# Patient Record
Sex: Female | Born: 1990 | Race: Black or African American | Hispanic: No | Marital: Single | Smoking: Never smoker
Health system: Southern US, Community
[De-identification: ages and names within clinical notes are randomized; demographics above are authoritative.]

---

## 2015-09-24 ENCOUNTER — Encounter (HOSPITAL_COMMUNITY): Payer: Self-pay | Admitting: Emergency Medicine

## 2015-09-24 ENCOUNTER — Emergency Department (INDEPENDENT_AMBULATORY_CARE_PROVIDER_SITE_OTHER)
Admission: EM | Admit: 2015-09-24 | Discharge: 2015-09-24 | Disposition: A | Discharge status: Home / Self Care | Payer: BLUE CROSS/BLUE SHIELD | Source: Home / Self Care | Attending: Emergency Medicine | Admitting: Emergency Medicine

## 2015-09-24 DIAGNOSIS — R0989 Other specified symptoms and signs involving the circulatory and respiratory systems: Secondary | ICD-10-CM

## 2015-09-24 NOTE — Discharge Instructions (Signed)
You likely swallowed the bit of latex glove. I do not see any sign of it being in your respiratory system. As long as you continue to be able to eat and drink normally, you don't have anything to worry about. If you develop trouble swallowing, please go to the emergency room.

## 2015-09-24 NOTE — ED Provider Notes (Signed)
CSN: [id number]     Arrival date & time 09/24/15  1554 History   First MD Initiated Contact with Patient 09/24/15 1635     Chief Complaint  Patient presents with  . Foreign Body   (Consider location/radiation/quality/duration/timing/severity/associated sxs/prior Treatment) HPI  She is a 25 year old woman here for possible foreign body. She states this afternoon she was eating a hot dog while wearing latex gloves and she accidentally swallowed part of the glove. She denies any difficulty breathing. She has been able to eat some bread after this incident. She does state it feels like there is something in her throat.  History reviewed. No pertinent past medical history. History reviewed. No pertinent past surgical history. No family history on file. Social History  Substance Use Topics  . Smoking status: Never Smoker   . Smokeless tobacco: None  . Alcohol Use: Yes   OB History    No data available     Review of Systems As in history of present illness Allergies  Review of patient's allergies indicates no known allergies.  Home Medications   Prior to Admission medications   Medication Sig Start Date End Date Taking? Authorizing Provider  metFORMIN (GLUCOPHAGE) 500 MG tablet Take by mouth 2 (two) times daily with a meal.   Yes Historical Provider, MD   Meds Ordered and Administered this Visit  Medications - No data to display  BP 120/74 mmHg  Pulse 70  Temp(Src) 98.4 F (36.9 C) (Oral)  SpO2 99%  LMP 08/14/2015 No data found.   Physical Exam  Constitutional: She is oriented to person, place, and time. She appears well-developed and well-nourished. No distress.  HENT:  Mouth/Throat: Oropharynx is clear and moist. No oropharyngeal exudate.  No foreign body visualized in oropharynx.  Cardiovascular: Normal rate.   Pulmonary/Chest: Effort normal.  Neurological: She is alert and oriented to person, place, and time.    ED Course  Procedures (including critical care  time)  Labs Review Labs Reviewed - No data to display  Imaging Review No results found.   MDM   1. Foreign body sensation in throat    She likely swallowed the latex glove. She does not have any respiratory distress and has been able to eat bread since the incident, I suspect she will not have any further problems. Return precautions reviewed.    Charm RingsErin J Honig, MD 09/24/15 [phone]

## 2015-09-24 NOTE — ED Notes (Signed)
Pt reports about an hour to 1.5 hours ago she was eating a hot dog and had latex gloves on... She bit the hotdog along w/the glove and swallowed it Since then, she's had a sensation of the piece of glove lodged in the back of her throat that's now causing some dyspnea A&O x4... Talking in complete sentences and laughing... No acute distress.

## 2017-09-27 DIAGNOSIS — N920 Excessive and frequent menstruation with regular cycle: Secondary | ICD-10-CM | POA: Diagnosis not present

## 2017-09-27 DIAGNOSIS — I499 Cardiac arrhythmia, unspecified: Secondary | ICD-10-CM | POA: Diagnosis not present

## 2017-09-27 DIAGNOSIS — Z0001 Encounter for general adult medical examination with abnormal findings: Secondary | ICD-10-CM | POA: Diagnosis not present

## 2017-09-27 DIAGNOSIS — Z13220 Encounter for screening for lipoid disorders: Secondary | ICD-10-CM | POA: Diagnosis not present

## 2017-09-27 DIAGNOSIS — Z6834 Body mass index (BMI) 34.0-34.9, adult: Secondary | ICD-10-CM | POA: Diagnosis not present

## 2017-09-27 DIAGNOSIS — Z130 Encounter for screening for diseases of the blood and blood-forming organs and certain disorders involving the immune mechanism: Secondary | ICD-10-CM | POA: Diagnosis not present

## 2017-09-27 DIAGNOSIS — E282 Polycystic ovarian syndrome: Secondary | ICD-10-CM | POA: Diagnosis not present

## 2017-09-27 DIAGNOSIS — Z131 Encounter for screening for diabetes mellitus: Secondary | ICD-10-CM | POA: Diagnosis not present

## 2017-12-29 ENCOUNTER — Encounter (HOSPITAL_COMMUNITY): Payer: Self-pay | Admitting: Emergency Medicine

## 2017-12-29 ENCOUNTER — Ambulatory Visit (HOSPITAL_COMMUNITY)
Admission: EM | Admit: 2017-12-29 | Discharge: 2017-12-29 | Disposition: A | Discharge status: Home / Self Care | Payer: BLUE CROSS/BLUE SHIELD | Attending: Family Medicine | Admitting: Family Medicine

## 2017-12-29 DIAGNOSIS — N750 Cyst of Bartholin's gland: Secondary | ICD-10-CM | POA: Diagnosis not present

## 2017-12-29 MED ORDER — SULFAMETHOXAZOLE-TRIMETHOPRIM 800-160 MG PO TABS: 1.0000 | ORAL_TABLET | Freq: Two times a day (BID) | ORAL | 0 refills | Status: AC

## 2017-12-29 NOTE — Discharge Instructions (Addendum)
Apply warm compresses 3-4x daily for 10-15 minutes Wash site daily with warm water and mild soap Keep covered to avoid friction Take antibiotic as prescribed and to completion [facility] and management of symptoms. Return or go to the ED if you have any new or worsening symptoms

## 2017-12-29 NOTE — ED Provider Notes (Signed)
[facility]   [id number] 12/29/17 Arrival Time: 1310   SUBJECTIVE:  Pricilla LarssonSherika Shanae Rolfe is a 27 y.o. female who presents with complaints of abrupt onset of abscess in her vagina that started yesterday.  She denies a precipitating event, such as shaving or trauma.  Describes pain as constant and "pressure."  Symptoms are made worse with coughing or laughing.  She reports similar symptoms in the past and had an incision and drainage performed.  She denies fever, chills, nausea, vomiting, abdominal or pelvic pain, urinary symptoms, vaginal itching, vaginal odor, vaginal bleeding, dyspareunia, vaginal rashes or lesions.   Patient's last menstrual period was 12/12/2017.  ROS: As per HPI.  History reviewed. No pertinent past medical history. History reviewed. No pertinent surgical history. No Known Allergies No current facility-administered medications on file prior to encounter.    Current Outpatient Medications on File Prior to Encounter  Medication Sig Dispense Refill  . metFORMIN (GLUCOPHAGE) 500 MG tablet Take by mouth 2 (two) times daily with a meal.      Social History   Socioeconomic History  . Marital status: Single    Spouse name: Not on file  . Number of children: Not on file  . Years of education: Not on file  . Highest education level: Not on file  Occupational History  . Not on file  Social Needs  . Financial resource strain: Not on file  . Food insecurity:    Worry: Not on file    Inability: Not on file  . Transportation needs:    Medical: Not on file    Non-medical: Not on file  Tobacco Use  . Smoking status: Never Smoker  Substance and Sexual Activity  . Alcohol use: Yes  . Drug use: No  . Sexual activity: Not on file  Lifestyle  . Physical activity:    Days per week: Not on file    Minutes per session: Not on file  . Stress: Not on file  Relationships  . Social connections:    Talks on phone: Not on file    Gets together: Not on file   Attends religious service: Not on file    Active member of club or organization: Not on file    Attends meetings of clubs or organizations: Not on file    Relationship status: Not on file  . Intimate partner violence:    Fear of current or ex partner: Not on file    Emotionally abused: Not on file    Physically abused: Not on file    Forced sexual activity: Not on file  Other Topics Concern  . Not on file  Social History Narrative  . Not on file   No family history on file.  OBJECTIVE:  Vitals:   12/29/17 1347  BP: 99/68  Pulse: 72  Resp: 16  Temp: 98.5 F (36.9 C)  SpO2: 100%     General appearance: alert, cooperative, appears stated age and no distress Throat: lips, mucosa, and tongue normal; teeth and gums normal Lungs: CTA bilaterally without adventitious breath sounds Heart: regular rate and rhythm.  Radial pulses 2+ symmetrical bilaterally Back: no CVA tenderness Abdomen: soft, non-tender; bowel sounds normal; no masses or organomegaly; no guarding or rebound tenderness GU: external examination without obvious vulvar lesions, erythema or discharge, small 1-2 cm left sided bartholin's cyst without obvious erythema or induration; mildly tender to palpation  Skin: warm and dry Psychological:  Alert and cooperative. Normal mood and affect.  ASSESSMENT & PLAN:  1. Bartholin cyst     Meds ordered this encounter  Medications  . sulfamethoxazole-trimethoprim (BACTRIM DS,SEPTRA DS) 800-160 MG tablet    Sig: Take 1 tablet by mouth 2 (two) times daily for 7 days.    Dispense:  14 tablet    Refill:  0    Order Specific Question:   Supervising Provider    Answer:   Isa Rankin [[id number]]   Apply warm compresses 3-4x daily for 10-15 minutes Keep covered to avoid friction Take antibiotic as prescribed and to completion Follow up with Center or [facility] for further evaluation and management of symptoms. Return or go to the ED if you have any new or  worsening symptoms   Reviewed expectations re: course of current medical issues. Questions answered. Outlined signs and symptoms indicating need for more acute intervention. Patient verbalized understanding. After Visit Summary given.       Rennis Harding, PA-C 12/29/17 1418

## 2017-12-29 NOTE — ED Triage Notes (Signed)
Pt states she had an abscess on her cervix two years ago and waited until it got bad, had to have it drained, pt states she feels like the same thing is happening but she wants to catch it early.

## 2017-12-30 ENCOUNTER — Encounter: Payer: Self-pay | Admitting: Advanced Practice Midwife

## 2017-12-30 ENCOUNTER — Ambulatory Visit (INDEPENDENT_AMBULATORY_CARE_PROVIDER_SITE_OTHER): Payer: BLUE CROSS/BLUE SHIELD | Admitting: Advanced Practice Midwife

## 2017-12-30 VITALS — BP 130/69 | HR 81 | Temp 100.2°F | Ht 64.0 in | Wt 192.1 lb

## 2017-12-30 DIAGNOSIS — Z0189 Encounter for other specified special examinations: Secondary | ICD-10-CM | POA: Diagnosis not present

## 2017-12-30 DIAGNOSIS — N751 Abscess of Bartholin's gland: Secondary | ICD-10-CM | POA: Diagnosis not present

## 2017-12-30 DIAGNOSIS — Z7689 Persons encountering health services in other specified circumstances: Secondary | ICD-10-CM

## 2017-12-30 NOTE — Progress Notes (Signed)
Patient ID: Jill LarssonSherika Shanae Mason, female   DOB: 03/01/1991, 27 y.o.   MRN: [id number] Bartholin Cyst I&D  Enlarged abscess palpated in front of the hymenal ring around 5 o' clock.  Written informed consent was obtained.  Discussed complications and possible outcomes of procedure including recurrence of cyst, scarring leading to infection, bleeding, dyspareunia, distortion of anatomy.  Patient was examined in the dorsal lithotomy position and mass was identified.  The area was prepped with Iodine and draped in a sterile manner. 1% Lidocaine (3 ml) was then used to infiltrate area on top of the cyst, behind the hymenal ring.  A 7 mm incision was made using a sterile scapel. Upon palpation of the mass, a moderate amount of bloody purulent drainage was expressed through the incision. Patient tolerated the procedure well, reported feeling " a lot better." - Bactrim DS bid x 7 days for treatment (Patient already on this from urgent care yesterday, and advised to complete the course) - Recommended Sitz baths bid and Motrin was given  prn pain.   She was told to call to be examined if she experiences increasing swelling, pain, vaginal discharge, or fever.  - She was instructed to wear a peripad to absorb discharge, and to maintain pelvic rest while healing   Jill ShellerHeather Aahna Mason 7:52 PM 12/30/17

## 2017-12-30 NOTE — Progress Notes (Signed)
[facility] due to labial pain - Dx w/bartholin's cyst Lt side.  She states the pain and swelling increased earlier today

## 2017-12-30 NOTE — Patient Instructions (Signed)
Bartholin Cyst or Abscess A Bartholin cyst is a fluid-filled sac that forms on a Bartholin gland. Bartholin glands are small glands that are found in the folds of skin (labia) on the sides of the lower opening of the vagina. This type of cyst causes a bulge on the side of the vagina. A cyst that is not large or infected may not cause problems. However, if the fluid in the cyst becomes infected, the cyst can turn into an abscess. An abscess may cause discomfort or pain. Follow these instructions at home:  Take medicines only as told by your doctor.  If you were prescribed an antibiotic medicine, finish all of it even if you start to feel better.  Apply warm, wet compresses to the area or take warm, shallow baths that cover your pelvic area (sitz baths). Do this many times each day or as told by your doctor.  Do not squeeze the cyst. Do not apply heavy pressure to it.  Do not have sex until the cyst has gone away.  If your cyst or abscess was opened by your doctor, a small piece of gauze or a drain may have been placed in the area. That lets the cyst drain. Do not remove the gauze or the drain until your doctor tells you it is okay to do that.  Do not wear tampons. Wear feminine pads as needed for any fluid or blood.  Keep all follow-up visits as told by your doctor. This is important. Contact a doctor if:  Your pain, puffiness (swelling), or redness in the area of the cyst gets worse.  You have fluid or pus pus coming from the cyst.  You have a fever. This information is not intended to replace advice given to you by your health care provider. Make sure you discuss any questions you have with your health care provider. Document Released: 09/07/2008 Document Revised: 11/17/2015 Document Reviewed: 01/25/2014 Elsevier Interactive Patient Education  2018 [employer]  

## 2018-03-12 ENCOUNTER — Encounter: Payer: Self-pay | Admitting: *Deleted

## 2018-06-06 ENCOUNTER — Emergency Department (HOSPITAL_COMMUNITY)
Admission: EM | Admit: 2018-06-06 | Discharge: 2018-06-06 | Disposition: A | Discharge status: Home / Self Care | Payer: BLUE CROSS/BLUE SHIELD | Attending: Emergency Medicine | Admitting: Emergency Medicine

## 2018-06-06 ENCOUNTER — Emergency Department (HOSPITAL_COMMUNITY): Payer: BLUE CROSS/BLUE SHIELD

## 2018-06-06 ENCOUNTER — Other Ambulatory Visit: Payer: Self-pay

## 2018-06-06 ENCOUNTER — Encounter (HOSPITAL_COMMUNITY): Payer: Self-pay

## 2018-06-06 DIAGNOSIS — Y92410 Unspecified street and highway as the place of occurrence of the external cause: Secondary | ICD-10-CM | POA: Diagnosis not present

## 2018-06-06 DIAGNOSIS — S199XXA Unspecified injury of neck, initial encounter: Secondary | ICD-10-CM | POA: Diagnosis not present

## 2018-06-06 DIAGNOSIS — Y999 Unspecified external cause status: Secondary | ICD-10-CM | POA: Diagnosis not present

## 2018-06-06 DIAGNOSIS — Z7984 Long term (current) use of oral hypoglycemic drugs: Secondary | ICD-10-CM | POA: Diagnosis not present

## 2018-06-06 DIAGNOSIS — Y9389 Activity, other specified: Secondary | ICD-10-CM | POA: Diagnosis not present

## 2018-06-06 DIAGNOSIS — R0789 Other chest pain: Secondary | ICD-10-CM | POA: Insufficient documentation

## 2018-06-06 DIAGNOSIS — S299XXA Unspecified injury of thorax, initial encounter: Secondary | ICD-10-CM | POA: Diagnosis not present

## 2018-06-06 DIAGNOSIS — M542 Cervicalgia: Secondary | ICD-10-CM | POA: Diagnosis not present

## 2018-06-06 DIAGNOSIS — M545 Low back pain: Secondary | ICD-10-CM | POA: Diagnosis not present

## 2018-06-06 DIAGNOSIS — R079 Chest pain, unspecified: Secondary | ICD-10-CM

## 2018-06-06 MED ORDER — ACETAMINOPHEN 325 MG PO TABS
650.0000 mg | ORAL_TABLET | Freq: Once | ORAL | Status: AC
Start: 2018-06-06 — End: 2018-06-06
  Administered 2018-06-06: 650 mg via ORAL
  Filled 2018-06-06: qty 2

## 2018-06-06 MED ORDER — METHOCARBAMOL 500 MG PO TABS: 500.0000 mg | ORAL_TABLET | Freq: Every evening | ORAL | 0 refills | Status: DC | PRN

## 2018-06-06 MED ORDER — LIDOCAINE 5 % EX PTCH
1.0000 | MEDICATED_PATCH | CUTANEOUS | Status: DC
  Administered 2018-06-06: 1 via TRANSDERMAL
  Filled 2018-06-06: qty 1

## 2018-06-06 MED ORDER — LIDOCAINE 5 % EX PTCH: 1.0000 | MEDICATED_PATCH | CUTANEOUS | 0 refills | Status: DC
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
## 2018-06-06 NOTE — ED Notes (Signed)
Patient verbalizes understanding of discharge instructions. Opportunity for questioning and answers were provided. Armband removed by staff, pt discharged from ED.  
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
## 2018-06-17 ENCOUNTER — Emergency Department (HOSPITAL_COMMUNITY)
Admission: EM | Admit: 2018-06-17 | Discharge: 2018-06-17 | Disposition: A | Discharge status: Home / Self Care | Payer: BLUE CROSS/BLUE SHIELD | Attending: Emergency Medicine | Admitting: Emergency Medicine

## 2018-06-17 ENCOUNTER — Emergency Department (HOSPITAL_COMMUNITY): Payer: BLUE CROSS/BLUE SHIELD

## 2018-06-17 ENCOUNTER — Encounter (HOSPITAL_COMMUNITY): Payer: Self-pay

## 2018-06-17 DIAGNOSIS — S299XXA Unspecified injury of thorax, initial encounter: Secondary | ICD-10-CM | POA: Diagnosis not present

## 2018-06-17 DIAGNOSIS — M549 Dorsalgia, unspecified: Secondary | ICD-10-CM | POA: Diagnosis present

## 2018-06-17 DIAGNOSIS — M545 Low back pain, unspecified: Secondary | ICD-10-CM

## 2018-06-17 DIAGNOSIS — M546 Pain in thoracic spine: Secondary | ICD-10-CM | POA: Diagnosis not present

## 2018-06-17 DIAGNOSIS — S3992XA Unspecified injury of lower back, initial encounter: Secondary | ICD-10-CM | POA: Diagnosis not present

## 2018-06-17 LAB — POC URINE PREG, ED: Preg Test, Ur: NEGATIVE

## 2018-06-17 MED ORDER — IBUPROFEN 400 MG PO TABS
600.0000 mg | ORAL_TABLET | Freq: Once | ORAL | Status: AC
Start: 2018-06-17 — End: 2018-06-17
  Administered 2018-06-17: 600 mg via ORAL
  Filled 2018-06-17: qty 1

## 2018-06-17 MED ORDER — LIDOCAINE 5 % EX PTCH: 1.0000 | MEDICATED_PATCH | CUTANEOUS | 0 refills | Status: DC

## 2018-06-17 MED ORDER — IBUPROFEN 600 MG PO TABS: 600.0000 mg | ORAL_TABLET | Freq: Four times a day (QID) | ORAL | 0 refills | Status: DC | PRN

## 2018-06-17 MED ORDER — HYDROCODONE-ACETAMINOPHEN 5-325 MG PO TABS
1.0000 | ORAL_TABLET | Freq: Once | ORAL | Status: AC
  Administered 2018-06-17: 1 via ORAL
  Filled 2018-06-17: qty 1

## 2018-06-17 MED ORDER — CYCLOBENZAPRINE HCL 10 MG PO TABS: 10.0000 mg | ORAL_TABLET | Freq: Every evening | ORAL | 0 refills | Status: DC | PRN
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
## 2018-06-17 NOTE — Discharge Instructions (Signed)
You were seen here today for Back Pain: Your xrays were reassuring  Low back pain is discomfort in the lower back that may be due to injuries to muscles and ligaments around the spine. Occasionally, it may be caused by a problem to a part of the spine called a disc. Your back pain should be treated with medicines listed below as well as back exercises and this back pain should get better over the next 2 weeks. Most patients get completely well in 4 weeks. It is important to know however, if you develop severe or worsening pain, low back pain with fever, numbness, weakness or inability to walk or urinate, you should return to the ER immediately.  Please follow up with your doctor this week for a recheck if still having symptoms.  HOME INSTRUCTIONS Self - care:  The application of heat can help soothe the pain.  Maintaining your daily activities, including walking (this is encouraged), as it will help you get better faster than just staying in bed. Do not life, push, pull anything more than 10 pounds for the next week. I am attaching back exercises that you can do at home to help facilitate your recovery.   Back Exercises - I have attached a handout on back exercises that can be done at home to help facilitate your recovery.   Medications are also useful to help with pain control.   Acetaminophen.  This medication is generally safe, and found over the counter. Take as directed for your age. You should not take more than 8 of the extra strength (500mg ) pills a day (max dose is 4000mg  total OVER one day)  Non steroidal anti inflammatory: This includes medications including Ibuprofen, naproxen and Mobic; These medications help both pain and swelling and are very useful in treating back pain.  They should be taken with food, as they can cause stomach upset, and more seriously, stomach bleeding. Do not combine the medications.   Muscle relaxants:  These medications can help with muscle tightness that is a  cause of lower back pain.  Most of these medications can cause drowsiness, and it is not safe to drive or use dangerous machinery while taking them. They are primarily helpful when taken at night before sleep.  You will need to follow up with your primary healthcare provider or the Orthopedist in 1-2 weeks for reassessment and persistent symptoms.  Be aware that if you develop new symptoms, such as a fever, leg weakness, difficulty with or loss of control of your urine or bowels, abdominal pain, or more severe pain, you will need to seek medical attention and/or return to the Emergency department. Additional Information:  Your vital signs today were: BP 119/73 (BP Location: Right Arm)    Pulse 71    Temp 98.3 F (36.8 C)    Resp 18    Ht 5' 3.5" (1.613 m)    Wt 86.2 kg    LMP 06/09/2018 (Approximate) Comment: NEG preg test 12/24   SpO2 100%    BMI 33.13 kg/m  If your blood pressure (BP) was elevated above 135/85 this visit, please have this repeated by your doctor within one month. ---------------

## 2018-06-17 NOTE — ED Notes (Signed)
Pt verbalized understanding of d/c instructions and has no further questions, VSS, NAD.  

## 2018-06-28 DIAGNOSIS — J069 Acute upper respiratory infection, unspecified: Secondary | ICD-10-CM | POA: Diagnosis not present

## 2018-06-28 DIAGNOSIS — R05 Cough: Secondary | ICD-10-CM | POA: Diagnosis not present

## 2018-06-28 DIAGNOSIS — J029 Acute pharyngitis, unspecified: Secondary | ICD-10-CM | POA: Diagnosis not present

## 2018-06-28 DIAGNOSIS — H10023 Other mucopurulent conjunctivitis, bilateral: Secondary | ICD-10-CM | POA: Diagnosis not present

## 2018-07-09 DIAGNOSIS — Z131 Encounter for screening for diabetes mellitus: Secondary | ICD-10-CM | POA: Diagnosis not present

## 2018-07-09 DIAGNOSIS — Z13220 Encounter for screening for lipoid disorders: Secondary | ICD-10-CM | POA: Diagnosis not present

## 2018-07-09 DIAGNOSIS — F430 Acute stress reaction: Secondary | ICD-10-CM | POA: Diagnosis not present

## 2018-07-09 DIAGNOSIS — S335XXD Sprain of ligaments of lumbar spine, subsequent encounter: Secondary | ICD-10-CM | POA: Diagnosis not present

## 2018-08-19 DIAGNOSIS — M545 Low back pain: Secondary | ICD-10-CM | POA: Diagnosis not present

## 2018-08-19 DIAGNOSIS — F430 Acute stress reaction: Secondary | ICD-10-CM | POA: Diagnosis not present

## 2018-08-24 DIAGNOSIS — R109 Unspecified abdominal pain: Secondary | ICD-10-CM | POA: Diagnosis not present

## 2018-08-24 DIAGNOSIS — N926 Irregular menstruation, unspecified: Secondary | ICD-10-CM | POA: Diagnosis not present

## 2018-08-24 DIAGNOSIS — R6889 Other general symptoms and signs: Secondary | ICD-10-CM | POA: Diagnosis not present

## 2018-08-24 DIAGNOSIS — K529 Noninfective gastroenteritis and colitis, unspecified: Secondary | ICD-10-CM | POA: Diagnosis not present
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
## 2020-02-22 ENCOUNTER — Other Ambulatory Visit: Payer: Self-pay | Admitting: Family Medicine

## 2020-02-22 ENCOUNTER — Other Ambulatory Visit: Payer: Self-pay

## 2020-02-22 ENCOUNTER — Ambulatory Visit
Admission: RE | Admit: 2020-02-22 | Discharge: 2020-02-22 | Disposition: A | Discharge status: Home / Self Care | Payer: PRIVATE HEALTH INSURANCE | Source: Ambulatory Visit | Attending: Family Medicine | Admitting: Family Medicine

## 2020-02-22 DIAGNOSIS — R053 Chronic cough: Secondary | ICD-10-CM

## 2021-02-13 DIAGNOSIS — N758 Other diseases of Bartholin's gland: Secondary | ICD-10-CM | POA: Diagnosis not present

## 2021-02-13 DIAGNOSIS — Z6837 Body mass index (BMI) 37.0-37.9, adult: Secondary | ICD-10-CM | POA: Diagnosis not present

## 2021-02-13 DIAGNOSIS — R030 Elevated blood-pressure reading, without diagnosis of hypertension: Secondary | ICD-10-CM | POA: Diagnosis not present

## 2021-02-13 DIAGNOSIS — E669 Obesity, unspecified: Secondary | ICD-10-CM | POA: Diagnosis not present

## 2021-03-30 DIAGNOSIS — Z01411 Encounter for gynecological examination (general) (routine) with abnormal findings: Secondary | ICD-10-CM | POA: Diagnosis not present

## 2021-03-30 DIAGNOSIS — N920 Excessive and frequent menstruation with regular cycle: Secondary | ICD-10-CM | POA: Diagnosis not present

## 2021-03-30 DIAGNOSIS — Z113 Encounter for screening for infections with a predominantly sexual mode of transmission: Secondary | ICD-10-CM | POA: Diagnosis not present

## 2021-03-30 DIAGNOSIS — Z8742 Personal history of other diseases of the female genital tract: Secondary | ICD-10-CM | POA: Insufficient documentation

## 2021-03-30 DIAGNOSIS — Z124 Encounter for screening for malignant neoplasm of cervix: Secondary | ICD-10-CM | POA: Diagnosis not present

## 2021-03-30 DIAGNOSIS — Z01419 Encounter for gynecological examination (general) (routine) without abnormal findings: Secondary | ICD-10-CM | POA: Diagnosis not present

## 2022-07-22 DIAGNOSIS — E559 Vitamin D deficiency, unspecified: Secondary | ICD-10-CM | POA: Insufficient documentation

## 2023-01-31 ENCOUNTER — Ambulatory Visit: Payer: PRIVATE HEALTH INSURANCE | Attending: Nurse Practitioner

## 2023-01-31 ENCOUNTER — Ambulatory Visit (INDEPENDENT_AMBULATORY_CARE_PROVIDER_SITE_OTHER): Payer: 59 | Admitting: Nurse Practitioner

## 2023-01-31 VITALS — BP 120/84 | HR 76 | Temp 98.0°F | Ht 63.5 in | Wt 231.2 lb

## 2023-01-31 DIAGNOSIS — Z0001 Encounter for general adult medical examination with abnormal findings: Secondary | ICD-10-CM

## 2023-01-31 DIAGNOSIS — Z6841 Body Mass Index (BMI) 40.0 and over, adult: Secondary | ICD-10-CM | POA: Insufficient documentation

## 2023-01-31 DIAGNOSIS — R002 Palpitations: Secondary | ICD-10-CM | POA: Diagnosis not present

## 2023-01-31 DIAGNOSIS — N926 Irregular menstruation, unspecified: Secondary | ICD-10-CM

## 2023-01-31 DIAGNOSIS — Z113 Encounter for screening for infections with a predominantly sexual mode of transmission: Secondary | ICD-10-CM

## 2023-01-31 DIAGNOSIS — Z0000 Encounter for general adult medical examination without abnormal findings: Secondary | ICD-10-CM

## 2023-01-31 NOTE — Assessment & Plan Note (Signed)
Chronic, intermittent.  Etiology unclear. Labs were for further evaluation. Long-term cardiac monitor also ordered for further evaluation. Shared decision making discussion we will hold off on cardiac echocardiogram, until review of labs and monitor results.

## 2023-01-31 NOTE — Assessment & Plan Note (Signed)
Labs ordered, further recommendations may be made based upon these results. 

## 2023-01-31 NOTE — Progress Notes (Unsigned)
Enrolled for [employer] to mail a ZIO XT long term holter monitor to the patients address on file.   DOD to read. 

## 2023-01-31 NOTE — Assessment & Plan Note (Signed)
Labs ordered, further recommendations may be made based upon his results. 

## 2023-01-31 NOTE — Progress Notes (Signed)
Complete physical exam  Patient: Jill Mason   DOB: 08-01-1990   32 y.o. Female  MRN: [id number]  Subjective:    Chief Complaint  Patient presents with   New Patient (Initial Visit)    Blood work, more for cholesterol and iron     Jill Mason is a 32 y.o. female who presents today for a complete physical exam.  She has not been with PCP regularly for few years. Resstablishing today for overall physical exam as well as to discuss heart palpitaitons and heavy menstrual bleeding.  Cardiac palpitations: Initially noticed about 5 years ago.  They are intermittent.  Seems to notice them more frequently when she is in the upper range of her normal weight fluctuation.  Does not drink significant amount of caffeine.  Does feel shortness of breath when palpitations occur. Heavy menstrual bleeding: Reports heavy bleeding and is concerned about possible anemia.  Does feel fatigued during her menstrual bleeding.  Does have an OB/GYN located in [facility]   Most recent fall risk assessment:     No data to display           Most recent depression screenings:    12/30/2017    7:29 PM  PHQ 2/9 Scores  PHQ - 2 Score 0  PHQ- 9 Score 0      Patient Active Problem List   Diagnosis Date Noted   Class 3 severe obesity without serious comorbidity with body mass index (BMI) of 40.0 to 44.9 in adult (HCC) 01/31/2023   Abnormal bleeding in menstrual cycle 01/31/2023   Encounter for general adult medical examination with abnormal findings 01/31/2023   Palpitations 01/31/2023   Screening for STD (sexually transmitted disease) 01/31/2023   No past medical history on file. No past surgical history on file. No family history on file. No Known Allergies    Patient Care Team: Patient, No Pcp Per as PCP - General (General Practice)   Outpatient Medications Prior to Visit  Medication Sig   [DISCONTINUED] cyclobenzaprine (FLEXERIL) 10 MG tablet Take 1 tablet (10 mg total) by  mouth at bedtime as needed for muscle spasms.   [DISCONTINUED] ibuprofen (ADVIL,MOTRIN) 600 MG tablet Take 1 tablet (600 mg total) by mouth every 6 (six) hours as needed.   [DISCONTINUED] lidocaine (LIDODERM) 5 % Place 1 patch onto the skin daily. Remove & Discard patch within 12 hours or as directed by MD   [DISCONTINUED] metFORMIN (GLUCOPHAGE) 500 MG tablet Take by mouth 2 (two) times daily with a meal.   [DISCONTINUED] methocarbamol (ROBAXIN) 500 MG tablet Take 1 tablet (500 mg total) by mouth at bedtime as needed for muscle spasms.   No facility-administered medications prior to visit.    Review of Systems  Constitutional:  Positive for malaise/fatigue. Negative for chills, fever and weight loss.  HENT:  Negative for congestion and sinus pain.   Eyes:  Negative for blurred vision and double vision.  Respiratory:  Positive for shortness of breath. Negative for cough and wheezing.   Cardiovascular:  Positive for palpitations. Negative for chest pain.  Gastrointestinal:  Negative for abdominal pain, blood in stool, nausea and vomiting.  Genitourinary:  Negative for hematuria.  Neurological:  Negative for dizziness, seizures, loss of consciousness and headaches.  Psychiatric/Behavioral:  Negative for depression and suicidal ideas. The patient is not nervous/anxious.           Objective:     BP 120/84   Pulse 76   Temp 98 F (36.7  C) (Temporal)   Ht 5' 3.5" (1.613 m)   Wt 231 lb 4 oz (104.9 kg)   LMP 01/23/2023   SpO2 98%   BMI 40.32 kg/m  BP Readings from Last 3 Encounters:  01/31/23 120/84  06/17/18 118/72  06/06/18 110/76   Wt Readings from Last 3 Encounters:  01/31/23 231 lb 4 oz (104.9 kg)  06/17/18 190 lb (86.2 kg)  06/06/18 190 lb (86.2 kg)      Physical Exam Vitals reviewed. Exam conducted with a chaperone present.  Constitutional:      Appearance: Normal appearance.  HENT:     Head: Normocephalic and atraumatic.     Right Ear: Tympanic membrane, ear  canal and external ear normal.     Left Ear: Tympanic membrane, ear canal and external ear normal.  Eyes:     General:        Right eye: No discharge.        Left eye: No discharge.     Extraocular Movements: Extraocular movements intact.     Conjunctiva/sclera: Conjunctivae normal.     Pupils: Pupils are equal, round, and reactive to light.  Neck:     Vascular: No carotid bruit.  Cardiovascular:     Rate and Rhythm: Normal rate and regular rhythm.     Pulses: Normal pulses.     Heart sounds: Normal heart sounds. No murmur heard. Pulmonary:     Effort: Pulmonary effort is normal.     Breath sounds: Normal breath sounds.  Chest:  Breasts:    Breasts are symmetrical.     Right: Normal.     Left: Normal.  Abdominal:     General: Abdomen is flat. Bowel sounds are normal. There is no distension.     Palpations: Abdomen is soft. There is no mass.     Tenderness: There is no abdominal tenderness.  Musculoskeletal:        General: No tenderness.     Cervical back: Neck supple. No muscular tenderness.     Right lower leg: No edema.     Left lower leg: No edema.  Lymphadenopathy:     Cervical: No cervical adenopathy.     Upper Body:     Right upper body: No supraclavicular adenopathy.     Left upper body: No supraclavicular adenopathy.  Skin:    General: Skin is warm and dry.  Neurological:     General: No focal deficit present.     Mental Status: She is alert and oriented to person, place, and time.     Motor: No weakness.     Gait: Gait normal.  Psychiatric:        Mood and Affect: Mood normal.        Behavior: Behavior normal.        Judgment: Judgment normal.      No results found for any visits on 01/31/23.     Assessment & Plan:    Routine Health Maintenance and Physical Exam   There is no immunization history on file for this patient.  Health Maintenance  Topic Date Due   HIV Screening  Never done   Hepatitis C Screening  Never done   DTaP/Tdap/Td (1 -  Tdap) Never done   PAP SMEAR-Modifier  Never done   COVID-19 Vaccine (1 - 2023-24 season) Never done   INFLUENZA VACCINE  01/24/2023   HPV VACCINES  Aged Out    Discussed health benefits of physical activity, and encouraged her to engage  in regular exercise appropriate for her age and condition.  Problem List Items Addressed This Visit       Other   Class 3 severe obesity without serious comorbidity with body mass index (BMI) of 40.0 to 44.9 in adult [facility])    Labs ordered, further recommendations may be made based upon these results       Relevant Orders   CBC   Comprehensive metabolic panel   Hemoglobin A1c   Lipid panel   TSH   Iron   Ferritin   hCG, serum, qualitative   Abnormal bleeding in menstrual cycle    Labs ordered, further recommendations may be made based upon his results       Relevant Orders   CBC   Comprehensive metabolic panel   Hemoglobin A1c   Lipid panel   TSH   Iron   Ferritin   hCG, serum, qualitative   Encounter for general adult medical examination with abnormal findings - Primary    Labs ordered, further recommendations may be made based upon these results.  Discussed healthy lifestyle, safety, and upcoming preventative screening tests over the next 10 years.  Handout provided.       Relevant Orders   Chlamydia/Neisseria Gonorrhoeae RNA,TMA,Urogenital   HIV Antibody (routine testing w rflx)   RPR   Urinalysis with Culture, if indicated   hCG, serum, qualitative   Palpitations    Chronic, intermittent.  Etiology unclear. Labs were for further evaluation. Long-term cardiac monitor also ordered for further evaluation. Shared decision making discussion we will hold off on cardiac echocardiogram, until review of labs and monitor results.      Relevant Orders   LONG TERM MONITOR (3-14 DAYS)   hCG, serum, qualitative   Screening for STD (sexually transmitted disease)    Labs ordered, further recommendations may be made based upon these  results       Relevant Orders   Chlamydia/Neisseria Gonorrhoeae RNA,TMA,Urogenital   HIV Antibody (routine testing w rflx)   RPR   hCG, serum, qualitative   Return in about 1 month (around 03/03/2023) for F/U with Makaylynn Bonillas.     Elenore Paddy, NP

## 2023-01-31 NOTE — Assessment & Plan Note (Signed)
Labs ordered, further recommendations may be made based upon these results.  Discussed healthy lifestyle, safety, and upcoming preventative screening tests over the next 10 years.  Handout provided.

## 2023-02-01 ENCOUNTER — Other Ambulatory Visit: Payer: Self-pay | Admitting: Nurse Practitioner

## 2023-02-01 ENCOUNTER — Encounter: Payer: Self-pay | Admitting: Nurse Practitioner

## 2023-02-01 ENCOUNTER — Ambulatory Visit: Payer: PRIVATE HEALTH INSURANCE

## 2023-02-01 ENCOUNTER — Other Ambulatory Visit: Payer: PRIVATE HEALTH INSURANCE

## 2023-02-01 ENCOUNTER — Other Ambulatory Visit (INDEPENDENT_AMBULATORY_CARE_PROVIDER_SITE_OTHER): Payer: 59

## 2023-02-01 DIAGNOSIS — R002 Palpitations: Secondary | ICD-10-CM

## 2023-02-01 DIAGNOSIS — N926 Irregular menstruation, unspecified: Secondary | ICD-10-CM | POA: Diagnosis not present

## 2023-02-01 DIAGNOSIS — Z0001 Encounter for general adult medical examination with abnormal findings: Secondary | ICD-10-CM | POA: Diagnosis not present

## 2023-02-01 DIAGNOSIS — Z131 Encounter for screening for diabetes mellitus: Secondary | ICD-10-CM | POA: Diagnosis not present

## 2023-02-01 DIAGNOSIS — Z6841 Body Mass Index (BMI) 40.0 and over, adult: Secondary | ICD-10-CM

## 2023-02-01 DIAGNOSIS — Z13220 Encounter for screening for lipoid disorders: Secondary | ICD-10-CM

## 2023-02-01 DIAGNOSIS — Z113 Encounter for screening for infections with a predominantly sexual mode of transmission: Secondary | ICD-10-CM

## 2023-02-01 LAB — COMPREHENSIVE METABOLIC PANEL
ALT: 23 U/L (ref 0–35)
AST: 22 U/L (ref 0–37)
Albumin: 4.1 g/dL (ref 3.5–5.2)
Alkaline Phosphatase: 54 U/L (ref 39–117)
BUN: 13 mg/dL (ref 6–23)
CO2: 26 mEq/L (ref 19–32)
Calcium: 9.6 mg/dL (ref 8.4–10.5)
Chloride: 105 mEq/L (ref 96–112)
Creatinine, Ser: 0.99 mg/dL (ref 0.40–1.20)
GFR: 75.93 mL/min (ref >60.00)
Glucose, Bld: 97 mg/dL (ref 70–99)
Potassium: 4.5 mEq/L (ref 3.5–5.1)
Sodium: 141 mEq/L (ref 135–145)
Total Bilirubin: 0.4 mg/dL (ref 0.2–1.2)
Total Protein: 7.4 g/dL (ref 6.0–8.3)

## 2023-02-01 LAB — HEMOGLOBIN A1C: Hgb A1c MFr Bld: 5.3 % (ref 4.6–6.5)

## 2023-02-01 LAB — LIPID PANEL
Cholesterol: 177 mg/dL (ref 0–200)
HDL: 60.2 mg/dL (ref >39.00)
LDL Cholesterol: 107 mg/dL — ABNORMAL HIGH (ref 0–99)
NonHDL: 117.16
Total CHOL/HDL Ratio: 3
Triglycerides: 53 mg/dL (ref 0.0–149.0)
VLDL: 10.6 mg/dL (ref 0.0–40.0)

## 2023-02-01 LAB — CBC
HCT: 37.6 % (ref 36.0–46.0)
Hemoglobin: 12 g/dL (ref 12.0–15.0)
MCHC: 32 g/dL (ref 30.0–36.0)
MCV: 92.2 fl (ref 78.0–100.0)
Platelets: 264 10*3/uL (ref 150.0–400.0)
RBC: 4.08 Mil/uL (ref 3.87–5.11)
RDW: 13.6 % (ref 11.5–15.5)
WBC: 4.2 10*3/uL (ref 4.0–10.5)

## 2023-02-01 LAB — URINALYSIS WITH CULTURE, IF INDICATED
Bilirubin Urine: NEGATIVE
Ketones, ur: NEGATIVE
Leukocytes,Ua: NEGATIVE
Nitrite: NEGATIVE
Specific Gravity, Urine: 1.02 (ref 1.000–1.030)
Total Protein, Urine: NEGATIVE
Urine Glucose: NEGATIVE
Urobilinogen, UA: 0.2 (ref 0.0–1.0)
WBC, UA: NONE SEEN (ref 0–?)
pH: 6 (ref 5.0–8.0)

## 2023-02-01 LAB — FERRITIN: Ferritin: 7.9 ng/mL — ABNORMAL LOW (ref 10.0–291.0)

## 2023-02-01 LAB — IRON: Iron: 40 ug/dL — ABNORMAL LOW (ref 42–145)

## 2023-02-01 LAB — TSH: TSH: 2.91 u[IU]/mL (ref 0.35–5.50)

## 2023-02-04 ENCOUNTER — Other Ambulatory Visit: Payer: Self-pay | Admitting: Nurse Practitioner

## 2023-02-04 DIAGNOSIS — N926 Irregular menstruation, unspecified: Secondary | ICD-10-CM

## 2023-02-05 LAB — CHLAMYDIA/NEISSERIA GONORRHOEAE RNA,TMA,UROGENTIAL

## 2023-02-07 ENCOUNTER — Encounter (INDEPENDENT_AMBULATORY_CARE_PROVIDER_SITE_OTHER): Payer: Self-pay

## 2023-03-14 ENCOUNTER — Ambulatory Visit (INDEPENDENT_AMBULATORY_CARE_PROVIDER_SITE_OTHER): Payer: 59 | Admitting: Nurse Practitioner

## 2023-03-14 VITALS — BP 124/88 | HR 67 | Temp 98.1°F | Ht 63.5 in | Wt 233.0 lb

## 2023-03-14 DIAGNOSIS — R319 Hematuria, unspecified: Secondary | ICD-10-CM

## 2023-03-14 DIAGNOSIS — N926 Irregular menstruation, unspecified: Secondary | ICD-10-CM | POA: Diagnosis not present

## 2023-03-14 DIAGNOSIS — R002 Palpitations: Secondary | ICD-10-CM

## 2023-03-14 LAB — URINALYSIS WITH CULTURE, IF INDICATED
Bilirubin Urine: NEGATIVE
Ketones, ur: NEGATIVE
Leukocytes,Ua: NEGATIVE
Nitrite: NEGATIVE
Specific Gravity, Urine: 1.025 (ref 1.000–1.030)
Urine Glucose: NEGATIVE
Urobilinogen, UA: 1 (ref 0.0–1.0)
pH: 6 (ref 5.0–8.0)

## 2023-03-14 LAB — MICROALBUMIN / CREATININE URINE RATIO
Creatinine,U: 271.7 mg/dL
Microalb Creat Ratio: 0.5 mg/g (ref 0.0–30.0)
Microalb, Ur: 1.5 mg/dL (ref 0.0–1.9)

## 2023-03-14 NOTE — Assessment & Plan Note (Signed)
Probably related to menstrual spotting last time urine testing was completed. Reports she is not spotting today. Recheck urine testing, further recommendations may be made based upon these results.

## 2023-03-14 NOTE — Progress Notes (Signed)
Established Patient Office Visit  Subjective   Patient ID: Jill Mason, female    DOB: Jan 14, 1991  Age: 32 y.o. MRN: [id number]  Chief Complaint  Patient presents with   Hyperlipidemia    HLD: LDL 107 on last fasting lipid panel.   Hematuria: Noted on last urine testing. Today, patient reports she was spotting at the time her last urine test was collected. Is scheduled to see OBGYN regarding her irregular periods and spotting between periods in November. Has history of PCOS.   Cardiac Palpitations: Underwent long-term cardiac monitor.  Wore it for a total of 11 days.  Denies any episodes of palpitations while wearing the monitor.  Monitor did not identify any significant arrhythmias.  Reports that palpitations occur really infrequently and mostly with stress.  Denies any knowledge of first-degree relative with cardiac disease.    Review of Systems  Respiratory:  Positive for shortness of breath. Negative for cough.   Cardiovascular:  Positive for palpitations. Negative for orthopnea, leg swelling and PND.      Objective:     BP 124/88   Pulse 67   Temp 98.1 F (36.7 C) (Temporal)   Ht 5' 3.5" (1.613 m)   Wt 233 lb (105.7 kg)   LMP 02/22/2023   SpO2 100%   BMI 40.63 kg/m    Physical Exam Vitals reviewed.  Constitutional:      General: She is not in acute distress.    Appearance: Normal appearance.  HENT:     Head: Normocephalic and atraumatic.  Cardiovascular:     Rate and Rhythm: Normal rate and regular rhythm.     Pulses: Normal pulses.     Heart sounds: Normal heart sounds.  Pulmonary:     Effort: Pulmonary effort is normal.     Breath sounds: Normal breath sounds.  Skin:    General: Skin is warm and dry.  Neurological:     General: No focal deficit present.     Mental Status: She is alert and oriented to person, place, and time.  Psychiatric:        Mood and Affect: Mood normal.        Behavior: Behavior normal.        Judgment: Judgment  normal.      No results found for any visits on 03/14/23.    The ASCVD Risk score (Arnett DK, et al., 2019) failed to calculate for the following reasons:   The 2019 ASCVD risk score is only valid for ages 65 to 83    Assessment & Plan:   Problem List Items Addressed This Visit       Other   Abnormal bleeding in menstrual cycle    Will follow-up with OBGYN as scheduled      Palpitations    Chronic, long-term cardiac monitor reassuring We had a shared decision-making discussion regarding repeating cardiac monitor as well as undergoing cardiac echocardiogram. Because patient's symptoms are so infrequent, she has elected to monitor symptoms.  If symptoms occur more frequently or longer induration she was told to call office at which point we can consider echocardiogram or repeat long-term cardiac monitor testing.  She reports understanding.       Hematuria - Primary    Probably related to menstrual spotting last time urine testing was completed. Reports she is not spotting today. Recheck urine testing, further recommendations may be made based upon these results.      Relevant Orders   Microalbumin / creatinine urine ratio  Urine Culture   Urinalysis with Culture, if indicated    Return in about 11 months (around 02/11/2024) for CPE with Renette Hsu.    Elenore Paddy, NP

## 2023-03-14 NOTE — Patient Instructions (Signed)
140min/week of exercise

## 2023-03-14 NOTE — Assessment & Plan Note (Signed)
Chronic, long-term cardiac monitor reassuring We had a shared decision-making discussion regarding repeating cardiac monitor as well as undergoing cardiac echocardiogram. Because patient's symptoms are so infrequent, she has elected to monitor symptoms.  If symptoms occur more frequently or longer induration she was told to call office at which point we can consider echocardiogram or repeat long-term cardiac monitor testing.  She reports understanding.

## 2023-03-14 NOTE — Assessment & Plan Note (Signed)
Will follow-up with OBGYN as scheduled

## 2023-03-15 LAB — URINE CULTURE: Result:: NO GROWTH

## 2023-03-15 LAB — ABO AND RH

## 2023-03-15 LAB — HCG, SERUM, QUALITATIVE: Preg, Serum: NEGATIVE

## 2023-03-20 ENCOUNTER — Other Ambulatory Visit: Payer: Self-pay | Admitting: Nurse Practitioner

## 2023-03-20 DIAGNOSIS — R3121 Asymptomatic microscopic hematuria: Secondary | ICD-10-CM

## 2023-04-02 ENCOUNTER — Ambulatory Visit: Payer: 59 | Admitting: Internal Medicine

## 2023-04-02 ENCOUNTER — Ambulatory Visit (INDEPENDENT_AMBULATORY_CARE_PROVIDER_SITE_OTHER): Payer: 59

## 2023-04-02 ENCOUNTER — Encounter: Payer: Self-pay | Admitting: Internal Medicine

## 2023-04-02 VITALS — BP 118/70 | HR 65 | Temp 98.3°F | Ht 63.5 in | Wt 228.0 lb

## 2023-04-02 DIAGNOSIS — E559 Vitamin D deficiency, unspecified: Secondary | ICD-10-CM

## 2023-04-02 DIAGNOSIS — M25562 Pain in left knee: Secondary | ICD-10-CM

## 2023-04-02 DIAGNOSIS — Z6841 Body Mass Index (BMI) 40.0 and over, adult: Secondary | ICD-10-CM

## 2023-04-02 DIAGNOSIS — E66813 Obesity, class 3: Secondary | ICD-10-CM | POA: Diagnosis not present

## 2023-04-02 DIAGNOSIS — M25561 Pain in right knee: Secondary | ICD-10-CM | POA: Diagnosis not present

## 2023-04-02 MED ORDER — MELOXICAM 15 MG PO TABS: 15.0000 mg | ORAL_TABLET | Freq: Every day | ORAL | 4 refills | Status: DC | PRN

## 2023-04-02 NOTE — Progress Notes (Unsigned)
Patient ID: Jill Mason, female   DOB: 21-Mar-1991, 32 y.o.   MRN: [id number]        Chief Complaint: follow up bilateral knee pain, obesity, low vit d       HPI:  Jill Mason is a 32 y.o. female here with c/o 3 days onset bilateral medial knee pain worse to walk downhill but no giveaweays or falls, has been sore and tender to touch with some swellling as well, and also has found it difficult to lose wt Pt denies chest pain, increased sob or doe, wheezing, orthopnea, PND, increased LE swelling, palpitations, dizziness or syncope.   Pt denies polydipsia, polyuria, or new focal neuro s/s.    Pt denies fever, wt loss, night sweats, loss of appetite, or other constitutional symptom        Wt Readings from Last 3 Encounters:  04/02/23 228 lb (103.4 kg)  03/14/23 233 lb (105.7 kg)  01/31/23 231 lb 4 oz (104.9 kg)   BP Readings from Last 3 Encounters:  04/02/23 118/70  03/14/23 124/88  01/31/23 120/84         History reviewed. No pertinent past medical history. History reviewed. No pertinent surgical history.  reports that she has never smoked. She has never used smokeless tobacco. She reports current alcohol use. She reports that she does not use drugs. family history is not on file. No Known Allergies No current outpatient medications on file prior to visit.   No current facility-administered medications on file prior to visit.        ROS:  All others reviewed and negative.  Objective        PE:  BP 118/70 (BP Location: Left Arm, Patient Position: Sitting, Cuff Size: Normal)   Pulse 65   Temp 98.3 F (36.8 C) (Oral)   Ht 5' 3.5" (1.613 m)   Wt 228 lb (103.4 kg)   LMP 03/16/2023 (Exact Date)   SpO2 99%   BMI 39.75 kg/m                 Constitutional: Pt appears in NAD               HENT: Head: NCAT.                Right Ear: External ear normal.                 Left Ear: External ear normal.                Eyes: . Pupils are equal, round, and reactive to light.  Conjunctivae and EOM are normal               Nose: without d/c or deformity               Neck: Neck supple. Gross normal ROM               Cardiovascular: Normal rate and regular rhythm.                 Pulmonary/Chest: Effort normal and breath sounds without rales or wheezing.                Abd:  Soft, NT, ND, + BS, no organomegaly               Neurological: Pt is alert. At baseline orientation, motor grossly intact               Skin: Skin is  warm. No rashes, no other new lesions, LE edema - none               Bilateral knees with anserine area tenderness mild swelilng, and mild tender bilateral medial joint lines  but o/w FROM               Psychiatric: Pt behavior is normal without agitation   Micro: none  Cardiac tracings I have personally interpreted today:  none  Pertinent Radiological findings (summarize): none   Lab Results  Component Value Date   WBC 4.2 02/01/2023   HGB 12.0 02/01/2023   HCT 37.6 02/01/2023   PLT 264.0 02/01/2023   GLUCOSE 97 02/01/2023   CHOL 177 02/01/2023   TRIG 53.0 02/01/2023   HDL 60.20 02/01/2023   LDLCALC 107 (H) 02/01/2023   ALT 23 02/01/2023   AST 22 02/01/2023   NA 141 02/01/2023   K 4.5 02/01/2023   CL 105 02/01/2023   CREATININE 0.99 02/01/2023   BUN 13 02/01/2023   CO2 26 02/01/2023   TSH 2.91 02/01/2023   HGBA1C 5.3 02/01/2023   MICROALBUR 1.5 03/14/2023   Assessment/Plan:  Jill Mason is a 32 y.o. Black or African American [2] female with  has no past medical history on file.  Bilateral knee pain Mild to mod, c/w possible bilateral anserine bursitis vs djd, for volt gel prn, and refer sports mediicne,  to f/u any worsening symptoms or concerns   Vitamin D deficiency  Stable, cont oral replacement, pt encouraged to continue oral replacement    Class 3 severe obesity without serious comorbidity with body mass index (BMI) of 40.0 to 44.9 in adult [facility]) Pt encouraged for wt loss with increased activity when  knees improved, and focus on walking on more level ground  Followup: Return if symptoms worsen or fail to improve.  Oliver Barre, MD 04/04/2023 9:51 AM  [facility] - [facility]

## 2023-04-02 NOTE — Patient Instructions (Signed)
Please take all new medication as prescribed - the mobic as needed for pain  Please continue all other medications as before, and refills have been done if requested.  Please have the pharmacy call with any other refills you may need.  Please keep your appointments with your specialists as you may have planned  Please go to the XRAY Department in the first floor for the x-ray testing  You will be contacted by phone if any changes need to be made immediately.  Otherwise, you will receive a letter about your results with an explanation, but please check with MyChart first.

## 2023-04-04 NOTE — Assessment & Plan Note (Signed)
  Stable, cont oral replacement, pt encouraged to continue oral replacement

## 2023-04-04 NOTE — Assessment & Plan Note (Signed)
Mild to mod, c/w possible bilateral anserine bursitis vs djd, for volt gel prn, and refer sports mediicne,  to f/u any worsening symptoms or concerns

## 2023-04-04 NOTE — Assessment & Plan Note (Signed)
Pt encouraged for wt loss with increased activity when knees improved, and focus on walking on more level ground

## 2023-04-05 ENCOUNTER — Emergency Department (HOSPITAL_BASED_OUTPATIENT_CLINIC_OR_DEPARTMENT_OTHER)
Admission: EM | Admit: 2023-04-05 | Discharge: 2023-04-05 | Disposition: A | Discharge status: Home / Self Care | Payer: 59 | Attending: Emergency Medicine | Admitting: Emergency Medicine

## 2023-04-05 ENCOUNTER — Other Ambulatory Visit: Payer: Self-pay

## 2023-04-05 ENCOUNTER — Encounter (HOSPITAL_BASED_OUTPATIENT_CLINIC_OR_DEPARTMENT_OTHER): Payer: Self-pay | Admitting: Emergency Medicine

## 2023-04-05 DIAGNOSIS — M7051 Other bursitis of knee, right knee: Secondary | ICD-10-CM | POA: Diagnosis not present

## 2023-04-05 DIAGNOSIS — Y9302 Activity, running: Secondary | ICD-10-CM | POA: Diagnosis not present

## 2023-04-05 DIAGNOSIS — M25561 Pain in right knee: Secondary | ICD-10-CM | POA: Diagnosis present

## 2023-04-05 DIAGNOSIS — M7050 Other bursitis of knee, unspecified knee: Secondary | ICD-10-CM

## 2023-04-05 DIAGNOSIS — M7052 Other bursitis of knee, left knee: Secondary | ICD-10-CM | POA: Insufficient documentation

## 2023-04-05 NOTE — Discharge Instructions (Addendum)
Your pain is likely due to anserine bursitis.  Please follow instruction below.  Continue with rest, ice, compress, and elevate your knees while resting.  You may follow-up with orthopedist specialist as needed for further care.

## 2023-04-05 NOTE — ED Provider Notes (Signed)
[facility] Provider Note   CSN: [id number] Arrival date & time: 04/05/23  2211     History  Chief Complaint  Patient presents with   Knee Pain    Sherelle Castelli is a 32 y.o. female.  The history is provided by the patient and medical records. No language interpreter was used.  Knee Pain    32 year old female with class III obesity presenting with complaints of pain to both knees.  Patient reports several days ago she was running down a hill when she felt pain to both of her knees.  She did not fall onto the ground and denies any specific injury.  Her pain is sharp throbbing persistent with with moving.  No associated fever, back pain, bowel bladder incontinence, hip or ankle pain.  Patient admits that she has been trying to exercise more including jogging and running and she is unsure if that contributed to her symptoms.  She was seen by her PCP office several days prior for her complaint and was given medication to help with her symptoms.  Her primary reason for coming to the ED today is hoping to get MRIs.  Home Medications Prior to Admission medications   Medication Sig Start Date End Date Taking? Authorizing Provider  meloxicam (MOBIC) 15 MG tablet Take 1 tablet (15 mg total) by mouth daily as needed for pain. 04/02/23   Corwin Levins, MD      Allergies    Patient has no known allergies.    Review of Systems   Review of Systems  All other systems reviewed and are negative.   Physical Exam Updated Vital Signs BP (!) 128/106 (BP Location: Right Arm)   Pulse 81   Temp 98.3 F (36.8 C)   Resp 18   Ht 5\' 3"  (1.6 m)   Wt 102.1 kg   LMP 03/16/2023 (Exact Date)   SpO2 98%   BMI 39.86 kg/m  Physical Exam Vitals and nursing note reviewed.  Constitutional:      General: She is not in acute distress.    Appearance: She is well-developed. She is obese.  HENT:     Head: Atraumatic.  Eyes:     Conjunctiva/sclera:  Conjunctivae normal.  Pulmonary:     Effort: Pulmonary effort is normal.  Musculoskeletal:        General: Tenderness (Tenderness noted to medial aspects of bilateral knee without any significant edema erythema or warmth.  Able to flex and extend knee no joint laxity noted.  Mild tenderness along the anterior tib-fib region.) present.     Cervical back: Neck supple.     Comments: No tenderness to bilateral hips and bilateral ankle, intact dorsalis pedis pulse bilaterally.  Skin:    Capillary Refill: Capillary refill takes less than 2 seconds.     Findings: No rash.  Neurological:     Mental Status: She is alert.  Psychiatric:        Mood and Affect: Mood normal.     ED Results / Procedures / Treatments   Labs (all labs ordered are listed, but only abnormal results are displayed) Labs Reviewed - No data to display  EKG None  Radiology No results found.  Procedures Procedures    Medications Ordered in ED Medications - No data to display  ED Course/ Medical Decision Making/ A&P  Medical Decision Making  BP (!) 128/106 (BP Location: Right Arm)   Pulse 81   Temp 98.3 F (36.8 C)   Resp 18   Ht 5\' 3"  (1.6 m)   Wt 102.1 kg   LMP 03/16/2023 (Exact Date)   SpO2 98%   BMI 39.86 kg/m   53:72 PM 32 year old female with class III obesity presenting with complaints of pain to both knees.  Patient reports several days ago she was running down a hill when she felt pain to both of her knees.  She did not fall onto the ground and denies any specific injury.  Her pain is sharp throbbing persistent with with moving.  No associated fever, back pain, bowel bladder incontinence, hip or ankle pain.  Patient admits that she has been trying to exercise more including jogging and running and she is unsure if that contributed to her symptoms.  She was seen by her PCP office several days prior for her complaint and was given medication to help with her  symptoms.  Her primary reason for coming to the ED today is hoping to get MRIs.  On exam, patient is laying comfortably in bed appears to be in no acute discomfort.  Exam remarkable for tenderness noted to bilateral knees primarily in the medial aspect of both knees and mild tenderness along the anterior tib-fib region.  No evidence to suggest septic joint, cellulitis, and I have low suspicion for fracture or dislocation.  Symptoms suggestive of either anserine bursitis versus shinsplints.  I recommend RICE therapy.  EMR reviewed patient was seen several days prior by her PCP for the same complaint.  I agree with the impression.  Encourage patient to follow-up with orthopedist for outpatient management as needed.  Does not think patient would benefit from x-rays today.  RICE therapy discussed.        Final Clinical Impression(s) / ED Diagnoses Final diagnoses:  Anserine bursitis    Rx / DC Orders ED Discharge Orders     None         Fayrene Helper, PA-C 04/05/23 2301    Rondel Baton, MD 04/06/23 1116

## 2023-04-05 NOTE — ED Triage Notes (Signed)
Pt reports she was jogging on Sat and "felt something shift" in her knees bilaterally, is now having medial swelling, pain and difficulty walking

## 2023-11-11 ENCOUNTER — Encounter: Payer: Self-pay | Admitting: Nurse Practitioner

## 2023-11-15 ENCOUNTER — Other Ambulatory Visit: Payer: Self-pay | Admitting: Nurse Practitioner

## 2023-11-15 ENCOUNTER — Ambulatory Visit (INDEPENDENT_AMBULATORY_CARE_PROVIDER_SITE_OTHER)

## 2023-11-15 ENCOUNTER — Ambulatory Visit: Payer: Self-pay | Admitting: Nurse Practitioner

## 2023-11-15 ENCOUNTER — Telehealth: Payer: Self-pay | Admitting: Nurse Practitioner

## 2023-11-15 ENCOUNTER — Ambulatory Visit (INDEPENDENT_AMBULATORY_CARE_PROVIDER_SITE_OTHER): Admitting: Nurse Practitioner

## 2023-11-15 VITALS — BP 132/82 | HR 66 | Temp 98.0°F | Ht 63.0 in | Wt 225.5 lb

## 2023-11-15 DIAGNOSIS — M62838 Other muscle spasm: Secondary | ICD-10-CM | POA: Insufficient documentation

## 2023-11-15 MED ORDER — METHOCARBAMOL 500 MG PO TABS: 500.0000 mg | ORAL_TABLET | Freq: Three times a day (TID) | ORAL | 0 refills | Status: DC | PRN

## 2023-11-15 MED ORDER — METHOCARBAMOL 1000 MG PO TABS: 1.0000 | ORAL_TABLET | Freq: Three times a day (TID) | ORAL | 1 refills | Status: DC | PRN

## 2023-11-15 NOTE — Telephone Encounter (Signed)
 Copied from CRM [id number]. Topic: General - Other >> Nov 15, 2023 12:17 PM Dorisann Garre T wrote: Reason for CRM: jamie from [facility] pharmacy is calling in regarding the patient medication Methocarbamol  1000 MG TABS  they dont have the 1000 mg but they do have the 500 mg for patient jamie the pharmacy would like to know if this medication can be prescribed for patient

## 2023-11-15 NOTE — Patient Instructions (Signed)
 Please call our office if symptoms persist for another 2 weeks.   If you experience pain becoming more severe, numbness or tingling down legs, inability to control your bowel or bladder then proceed to the emergency department.  If you decide you would like to be evaluated by sports medicine, please send a MyChart message and I can order referral.

## 2023-11-15 NOTE — Progress Notes (Signed)
 Established Patient Office Visit  Subjective   Patient ID: Jill Mason, female    DOB: 18-Nov-1990  Age: 33 y.o. MRN: [id number]  Chief Complaint  Patient presents with   Spasms    Patient has today for acute visit for back pain and muscle spasms. Symptom onset 1 month ago.  Spasms are new as they started earlier this week.  No recent trauma.  Aggravating triggers include turning in bed, walking, standing too long.  Reports pain is sharp and triggered by above.  Often will subside with rest.  Denies any radiation of pain to legs, no numbness or tingling down legs, no new bowel or bladder incontinence.        Objective:     BP 132/82   Pulse 66   Temp 98 F (36.7 C) (Temporal)   Ht 5\' 3"  (1.6 m)   Wt 225 lb 8 oz (102.3 kg)   BMI 39.95 kg/m    Physical Exam Vitals reviewed.  Constitutional:      General: She is not in acute distress.    Appearance: Normal appearance.  HENT:     Head: Normocephalic and atraumatic.  Neck:     Vascular: No carotid bruit.  Cardiovascular:     Rate and Rhythm: Normal rate and regular rhythm.     Pulses: Normal pulses.     Heart sounds: Normal heart sounds.  Pulmonary:     Effort: Pulmonary effort is normal.     Breath sounds: Normal breath sounds.  Musculoskeletal:     Cervical back: Normal.     Thoracic back: Normal.     Lumbar back: Normal. No tenderness or bony tenderness. Negative right straight leg raise test and negative left straight leg raise test.  Skin:    General: Skin is warm and dry.  Neurological:     General: No focal deficit present.     Mental Status: She is alert and oriented to person, place, and time.     Sensory: Sensation is intact.     Motor: Motor function is intact.     Gait: Gait is intact.  Psychiatric:        Mood and Affect: Mood normal.        Behavior: Behavior normal.        Judgment: Judgment normal.      No results found for any visits on 11/15/23.    The ASCVD Risk score  (Arnett DK, et al., 2019) failed to calculate for the following reasons:   The 2019 ASCVD risk score is only valid for ages 32 to 87    Assessment & Plan:   Problem List Items Addressed This Visit       Other   Muscle spasm - Primary   Acute Will trial methocarbamol  1000 mg 3 times daily as needed.  Patient warned of potential for sedation and to avoid driving or operating heavy machinery while taking the medication.  She can also take Tylenol  and or ibuprofen  as needed.  No red flag signs or symptoms at this time.  Will get lumbar spine x-ray as symptoms have been present for at least 4 weeks.  If symptoms do not improve within the next 2 weeks may consider referral to sports medicine or orthopedics.  She was given ER precautions and educated on signs symptoms or cauda equina syndrome.      Relevant Medications   Methocarbamol  1000 MG TABS   Other Relevant Orders   DG Lumbar Spine 2-3  Views    Return if symptoms worsen or fail to improve.    Zorita Hiss, NP

## 2023-11-15 NOTE — Assessment & Plan Note (Signed)
 Acute Will trial methocarbamol  1000 mg 3 times daily as needed.  Patient warned of potential for sedation and to avoid driving or operating heavy machinery while taking the medication.  She can also take Tylenol  and or ibuprofen  as needed.  No red flag signs or symptoms at this time.  Will get lumbar spine x-ray as symptoms have been present for at least 4 weeks.  If symptoms do not improve within the next 2 weeks may consider referral to sports medicine or orthopedics.  She was given ER precautions and educated on signs symptoms or cauda equina syndrome.

## 2023-12-11 ENCOUNTER — Other Ambulatory Visit: Payer: Self-pay | Admitting: Family

## 2023-12-11 DIAGNOSIS — M545 Low back pain, unspecified: Secondary | ICD-10-CM

## 2023-12-11 DIAGNOSIS — M62838 Other muscle spasm: Secondary | ICD-10-CM

## 2024-01-01 ENCOUNTER — Ambulatory Visit (INDEPENDENT_AMBULATORY_CARE_PROVIDER_SITE_OTHER): Admitting: Physical Medicine and Rehabilitation

## 2024-01-01 ENCOUNTER — Encounter: Payer: Self-pay | Admitting: Physical Medicine and Rehabilitation

## 2024-01-01 DIAGNOSIS — S39012A Strain of muscle, fascia and tendon of lower back, initial encounter: Secondary | ICD-10-CM | POA: Diagnosis not present

## 2024-01-01 DIAGNOSIS — M5442 Lumbago with sciatica, left side: Secondary | ICD-10-CM

## 2024-01-01 MED ORDER — CYCLOBENZAPRINE HCL 10 MG PO TABS: 10.0000 mg | ORAL_TABLET | Freq: Every day | ORAL | 0 refills | Status: DC

## 2024-01-01 NOTE — Progress Notes (Unsigned)
 Pain Scale   Average Pain 4 Patient advising she has lower back pain radiating to left buttock area/ patient advising that twisting and turning also causes pain.        +Driver, -BT, -Dye Allergies.

## 2024-01-01 NOTE — Progress Notes (Unsigned)
 Jill Mason - 33 y.o. female MRN [id number]  Date of birth: Feb 26, 1991  Office Visit Note: Visit Date: 01/01/2024 PCP: Elnor Lauraine BRAVO, NP Referred by: Douglass Kenney NOVAK, FNP  Subjective: Chief Complaint  Patient presents with   Lower Back - Pain   HPI: Jill Mason is a 33 y.o. female who comes in today per the request of Kenney Douglass, NP for evaluation of acute bilateral lower back pain, intermittent radiation of pain to left buttock. Pain started about 1 month ago. Frequent bending and prolonged standing increases her pain. She describes pain as sore and sharp sensation, currently rates as 4 out of 10. Some relief of pain with home exercise regimen, rest and use of medications. She tried Meloxicam  and Robaxin  with some relief of pain. Recent lumbar radiographs shows normal alignment, well preserved disc spacing, no spondylolisthesis. No history of lumbar surgery/injections. We currently works full time at [employer], her job involves sitting for long periods of time. Patient denies focal weakness, numbness and tingling. No recent trauma or falls.      Review of Systems  Musculoskeletal:  Positive for back pain and myalgias.  Neurological:  Negative for tingling, sensory change, focal weakness and weakness.  All other systems reviewed and are negative.  Otherwise per HPI.  Assessment & Plan: Visit Diagnoses:    ICD-10-CM   1. Acute bilateral low back pain with left-sided sciatica  M54.42     2. Acute myofascial strain of lumbar region, initial encounter  S39.012A        Plan: Findings:  Acute bilateral lower back pain, intermittent radiation of pain to left buttock. Overall, her pain has improved since initial onset of symptoms. She continues with home exercise regimen, rest and use of medications. Patients clinical presentation and exam are consistent with acute lumbar myofascial strain. I discussed recent lumbar radiographs with her today using imaging and spine  model. Lumbar x-rays look fairly normal for her age. We discussed treatment plan in detail today. Next step is to place order for short course of formal physical therapy. I do think she will benefit from core strengthening and establishing home exercise regimen. I also discussed medication management and prescribed Flexeril  for her to take as needed at bedtime. She has no questions at this time. I would like to see her back in approximately 8 weeks for re-evaluation post physical therapy. No red flag symptoms noted upon exam today.     Meds & Orders: No orders of the defined types were placed in this encounter.  No orders of the defined types were placed in this encounter.   Follow-up: Return for 8 week follow up post physical therapy.   Procedures: No procedures performed      Clinical History: No specialty comments available.   She reports that she has never smoked. She has never used smokeless tobacco.  Recent Labs    02/01/23 0840  HGBA1C 5.3    Objective:  VS:  HT:    WT:   BMI:     BP:   HR: bpm  TEMP: ( )  RESP:  Physical Exam Vitals and nursing note reviewed.  HENT:     Head: Normocephalic and atraumatic.     Right Ear: External ear normal.     Left Ear: External ear normal.     Nose: Nose normal.     Mouth/Throat:     Mouth: Mucous membranes are moist.  Eyes:     Extraocular Movements: Extraocular  movements intact.  Cardiovascular:     Rate and Rhythm: Normal rate.     Pulses: Normal pulses.  Pulmonary:     Effort: Pulmonary effort is normal.  Abdominal:     General: Abdomen is flat. There is no distension.  Musculoskeletal:        General: Tenderness present.     Cervical back: Normal range of motion.     Comments: Patient rises from seated position to standing without difficulty. Good lumbar range of motion. No pain noted with facet loading. 5/5 strength noted with bilateral hip flexion, knee flexion/extension, ankle dorsiflexion/plantarflexion and EHL. No  clonus noted bilaterally. No pain upon palpation of greater trochanters. No pain with internal/external rotation of bilateral hips. Sensation intact bilaterally. Negative slump test bilaterally. Ambulates without aid, gait steady.     Skin:    General: Skin is warm and dry.     Capillary Refill: Capillary refill takes less than 2 seconds.  Neurological:     General: No focal deficit present.     Mental Status: She is alert and oriented to person, place, and time.  Psychiatric:        Mood and Affect: Mood normal.        Behavior: Behavior normal.     Ortho Exam  Imaging: No results found.  Past Medical/Family/Surgical/Social History: Medications & Allergies reviewed per EMR, new medications updated. Patient Active Problem List   Diagnosis Date Noted   Muscle spasm 11/15/2023   Bilateral knee pain 04/02/2023   Hematuria 03/14/2023   Class 3 severe obesity without serious comorbidity with body mass index (BMI) of 40.0 to 44.9 in adult 01/31/2023   Abnormal bleeding in menstrual cycle 01/31/2023   Encounter for general adult medical examination with abnormal findings 01/31/2023   Palpitations 01/31/2023   Screening for STD (sexually transmitted disease) 01/31/2023   Vitamin D deficiency 07/22/2022   History of PCOS 03/30/2021   History reviewed. No pertinent past medical history. History reviewed. No pertinent family history. History reviewed. No pertinent surgical history. Social History   Occupational History   Not on file  Tobacco Use   Smoking status: Never   Smokeless tobacco: Never  Vaping Use   Vaping status: Never Used  Substance and Sexual Activity   Alcohol use: Yes    Comment: occ   Drug use: No   Sexual activity: Yes    Partners: Male    Birth control/protection: Condom    Comment: condom use is intermittent

## 2024-02-13 ENCOUNTER — Encounter: Payer: 59 | Admitting: Nurse Practitioner

## 2024-02-27 ENCOUNTER — Ambulatory Visit: Admitting: Physical Medicine and Rehabilitation

## 2024-03-26 ENCOUNTER — Encounter: Admitting: Nurse Practitioner

## 2024-04-27 ENCOUNTER — Encounter: Payer: Self-pay | Admitting: Radiology

## 2024-05-28 ENCOUNTER — Ambulatory Visit: Admitting: Nurse Practitioner

## 2024-05-28 VITALS — BP 128/70 | HR 71 | Temp 97.6°F | Ht 63.0 in | Wt 231.0 lb

## 2024-05-28 DIAGNOSIS — Z0001 Encounter for general adult medical examination with abnormal findings: Secondary | ICD-10-CM

## 2024-05-28 DIAGNOSIS — E559 Vitamin D deficiency, unspecified: Secondary | ICD-10-CM

## 2024-05-28 DIAGNOSIS — E66813 Obesity, class 3: Secondary | ICD-10-CM

## 2024-05-28 LAB — COMPREHENSIVE METABOLIC PANEL WITH GFR
ALT: 22 U/L (ref 0–35)
AST: 22 U/L (ref 0–37)
Albumin: 4.2 g/dL (ref 3.5–5.2)
Alkaline Phosphatase: 56 U/L (ref 39–117)
BUN: 9 mg/dL (ref 6–23)
CO2: 27 meq/L (ref 19–32)
Calcium: 9.1 mg/dL (ref 8.4–10.5)
Chloride: 105 meq/L (ref 96–112)
Creatinine, Ser: 0.92 mg/dL (ref 0.40–1.20)
GFR: 82.16 mL/min (ref >60.00)
Glucose, Bld: 86 mg/dL (ref 70–99)
Potassium: 4 meq/L (ref 3.5–5.1)
Sodium: 139 meq/L (ref 135–145)
Total Bilirubin: 0.6 mg/dL (ref 0.2–1.2)
Total Protein: 7.2 g/dL (ref 6.0–8.3)

## 2024-05-28 LAB — TSH: TSH: 2.89 u[IU]/mL (ref 0.35–5.50)

## 2024-05-28 LAB — LIPID PANEL
Cholesterol: 158 mg/dL (ref 0–200)
HDL: 59.9 mg/dL (ref >39.00)
LDL Cholesterol: 90 mg/dL (ref 0–99)
NonHDL: 98.39
Total CHOL/HDL Ratio: 3
Triglycerides: 40 mg/dL (ref 0.0–149.0)
VLDL: 8 mg/dL (ref 0.0–40.0)

## 2024-05-28 LAB — CBC
HCT: 35.5 % — ABNORMAL LOW (ref 36.0–46.0)
Hemoglobin: 11.9 g/dL — ABNORMAL LOW (ref 12.0–15.0)
MCHC: 33.5 g/dL (ref 30.0–36.0)
MCV: 92.6 fl (ref 78.0–100.0)
Platelets: 235 K/uL (ref 150.0–400.0)
RBC: 3.83 Mil/uL — ABNORMAL LOW (ref 3.87–5.11)
RDW: 12.8 % (ref 11.5–15.5)
WBC: 3.8 K/uL — ABNORMAL LOW (ref 4.0–10.5)

## 2024-05-28 LAB — HEMOGLOBIN A1C: Hgb A1c MFr Bld: 5.1 % (ref 4.6–6.5)

## 2024-05-28 LAB — VITAMIN D 25 HYDROXY (VIT D DEFICIENCY, FRACTURES): VITD: 22.58 ng/mL — ABNORMAL LOW (ref 30.00–100.00)

## 2024-05-28 NOTE — Progress Notes (Signed)
 Complete physical exam  Patient: Jill Mason   DOB: 09-13-1990   33 y.o. Female  MRN: [id number]  Subjective:    Chief Complaint  Patient presents with  . Annual Exam    Jill Mason is a 33 y.o. female who presents today for a complete physical exam.   Discussed the use of AI scribe software for clinical note transcription with the patient, who gave verbal consent to proceed.  History of Present Illness Jill Mason is a 33 year old female who presents for an annual physical exam.  Preventive health maintenance - No influenza vaccination received this year; declines vaccination today - Uncertain of last tetanus vaccination - Last Pap smear performed last year; results normal with negative HPV. Do not have access to records today, but patient is able to show me results on her personal patient portal. Test completed 09/25/2023.     Most recent fall risk assessment:    04/02/2023    8:46 AM  Fall Risk   Falls in the past year? 0  Number falls in past yr: 0  Injury with Fall? 0   Risk for fall due to : No Fall Risks  Follow up Falls evaluation completed     Data saved with a previous flowsheet row definition     Most recent depression screenings:    04/02/2023    8:46 AM 03/14/2023    1:48 PM  PHQ 2/9 Scores  PHQ - 2 Score 0 0     No past medical history on file. No past surgical history on file. Social History   Socioeconomic History  . Marital status: Single    Spouse name: Not on file  . Number of children: Not on file  . Years of education: Not on file  . Highest education level: Bachelor's degree (e.g., BA, AB, BS)  Occupational History  . Not on file  Tobacco Use  . Smoking status: Never  . Smokeless tobacco: Never  Vaping Use  . Vaping status: Never Used  Substance and Sexual Activity  . Alcohol use: Yes    Comment: occ  . Drug use: No  . Sexual activity: Yes    Partners: Male    Birth control/protection: Condom     Comment: condom use is intermittent  Other Topics Concern  . Not on file  Social History Narrative  . Not on file   Social Drivers of Health   Financial Resource Strain: Low Risk  (03/14/2023)   Overall Financial Resource Strain (CARDIA)   . Difficulty of Paying Living Expenses: Not very hard  Food Insecurity: Food Insecurity Present (03/14/2023)   Hunger Vital Sign   . Worried About Programme Researcher, Broadcasting/film/video in the Last Year: Sometimes true   . Ran Out of Food in the Last Year: Sometimes true  Transportation Needs: No Transportation Needs (03/14/2023)   PRAPARE - Transportation   . Lack of Transportation (Medical): No   . Lack of Transportation (Non-Medical): No  Physical Activity: Insufficiently Active (03/14/2023)   Exercise Vital Sign   . Days of Exercise per Week: 2 days   . Minutes of Exercise per Session: 60 min  Stress: No Stress Concern Present (03/14/2023)   [employer] of Occupational Health - Occupational Stress Questionnaire   . Feeling of Stress : Only a little  Social Connections: Moderately Isolated (03/14/2023)   Social Connection and Isolation Panel   . Frequency of Communication with Friends and Family: Three times a week   .  Frequency of Social Gatherings with Friends and Family: Once a week   . Attends Religious Services: More than 4 times per year   . Active Member of Clubs or Organizations: No   . Attends Banker Meetings: Not on file   . Marital Status: Never married  Intimate Partner Violence: Not on file   No family history on file. No Known Allergies    Patient Care Team: Elnor Lauraine BRAVO, NP as PCP - General (Nurse Practitioner)   Outpatient Medications Prior to Visit  Medication Sig  . [DISCONTINUED] cyclobenzaprine  (FLEXERIL ) 10 MG tablet Take 1 tablet (10 mg total) by mouth at bedtime.  . [DISCONTINUED] meloxicam  (MOBIC ) 15 MG tablet Take 1 tablet (15 mg total) by mouth daily as needed for pain. (Patient not taking: Reported on  11/15/2023)  . [DISCONTINUED] methocarbamol  (ROBAXIN ) 500 MG tablet Take 1-2 tablets (500-1,000 mg total) by mouth every 8 (eight) hours as needed for muscle spasms.   No facility-administered medications prior to visit.    Review of Systems  Constitutional:  Negative for fever and weight loss.  Eyes:  Negative for blurred vision and double vision.  Respiratory:  Negative for cough, shortness of breath and wheezing.   Cardiovascular:  Negative for chest pain and palpitations.  Gastrointestinal:  Negative for abdominal pain and blood in stool.  Genitourinary:  Negative for dysuria and hematuria.  Skin:  Negative for itching and rash.  Neurological:  Negative for dizziness and headaches.  Psychiatric/Behavioral:  Negative for depression. The patient is not nervous/anxious and does not have insomnia.           Objective:     BP 128/70   Pulse 71   Temp 97.6 F (36.4 C) (Temporal)   Ht 5' 3 (1.6 m)   Wt 231 lb (104.8 kg)   LMP 05/27/2024   SpO2 97%   BMI 40.92 kg/m  BP Readings from Last 3 Encounters:  05/28/24 128/70  11/15/23 132/82  04/05/23 114/67   Wt Readings from Last 3 Encounters:  05/28/24 231 lb (104.8 kg)  11/15/23 225 lb 8 oz (102.3 kg)  04/05/23 225 lb (102.1 kg)      Physical Exam Vitals reviewed. Exam conducted with a chaperone present.  Constitutional:      Appearance: Normal appearance.  HENT:     Head: Normocephalic and atraumatic.     Right Ear: Ear canal and external ear normal. There is no impacted cerumen.     Left Ear: Ear canal and external ear normal. There is no impacted cerumen.  Eyes:     General:        Right eye: No discharge.        Left eye: No discharge.     Extraocular Movements: Extraocular movements intact.     Conjunctiva/sclera: Conjunctivae normal.     Pupils: Pupils are equal, round, and reactive to light.  Neck:     Vascular: No carotid bruit.  Cardiovascular:     Rate and Rhythm: Normal rate and regular rhythm.      Pulses: Normal pulses.     Heart sounds: Normal heart sounds. No murmur heard. Pulmonary:     Effort: Pulmonary effort is normal.     Breath sounds: Normal breath sounds.  Chest:  Breasts:    Breasts are symmetrical.     Right: Normal.     Left: Normal.  Abdominal:     General: Abdomen is flat. Bowel sounds are normal. There is no distension.  Palpations: Abdomen is soft. There is no mass.     Tenderness: There is no abdominal tenderness.  Musculoskeletal:        General: No tenderness.     Cervical back: Neck supple. No muscular tenderness.     Right lower leg: No edema.     Left lower leg: No edema.  Lymphadenopathy:     Cervical: No cervical adenopathy.     Upper Body:     Right upper body: No supraclavicular adenopathy.     Left upper body: No supraclavicular adenopathy.  Skin:    General: Skin is warm and dry.  Neurological:     General: No focal deficit present.     Mental Status: She is alert and oriented to person, place, and time.     Motor: No weakness.     Gait: Gait normal.  Psychiatric:        Mood and Affect: Mood normal.        Behavior: Behavior normal.        Judgment: Judgment normal.      Results for orders placed or performed in visit on 05/28/24  VITAMIN D  25 Hydroxy (Vit-D Deficiency, Fractures)  Result Value Ref Range   VITD 22.58 (L) 30.00 - 100.00 ng/mL  TSH  Result Value Ref Range   TSH 2.89 0.35 - 5.50 uIU/mL  Lipid panel  Result Value Ref Range   Cholesterol 158 0 - 200 mg/dL   Triglycerides 59.9 0.0 - 149.0 mg/dL   HDL 40.09 >60.99 mg/dL   VLDL 8.0 0.0 - 59.9 mg/dL   LDL Cholesterol 90 0 - 99 mg/dL   Total CHOL/HDL Ratio 3    NonHDL 98.39   Hemoglobin A1c  Result Value Ref Range   Hgb A1c MFr Bld 5.1 4.6 - 6.5 %  Comprehensive metabolic panel with GFR  Result Value Ref Range   Sodium 139 135 - 145 mEq/L   Potassium 4.0 3.5 - 5.1 mEq/L   Chloride 105 96 - 112 mEq/L   CO2 27 19 - 32 mEq/L   Glucose, Bld 86 70 - 99 mg/dL    BUN 9 6 - 23 mg/dL   Creatinine, Ser 9.07 0.40 - 1.20 mg/dL   Total Bilirubin 0.6 0.2 - 1.2 mg/dL   Alkaline Phosphatase 56 39 - 117 U/L   AST 22 0 - 37 U/L   ALT 22 0 - 35 U/L   Total Protein 7.2 6.0 - 8.3 g/dL   Albumin 4.2 3.5 - 5.2 g/dL   GFR 17.83 >39.99 mL/min   Calcium 9.1 8.4 - 10.5 mg/dL  CBC  Result Value Ref Range   WBC 3.8 (L) 4.0 - 10.5 K/uL   RBC 3.83 (L) 3.87 - 5.11 Mil/uL   Platelets 235.0 150.0 - 400.0 K/uL   Hemoglobin 11.9 (L) 12.0 - 15.0 g/dL   HCT 64.4 (L) 63.9 - 53.9 %   MCV 92.6 78.0 - 100.0 fl   MCHC 33.5 30.0 - 36.0 g/dL   RDW 87.1 88.4 - 84.4 %       Assessment & Plan:    Routine Health Maintenance and Physical Exam   There is no immunization history on file for this patient.  Health Maintenance  Topic Date Due  . Hepatitis C Screening  Never done  . Hepatitis B Vaccines 19-59 Average Risk (1 of 3 - 19+ 3-dose series) Never done  . HPV VACCINES (1 - 3-dose SCDM series) Never done  . Cervical Cancer Screening (HPV/Pap  Cotest)  Never done  . COVID-19 Vaccine (1 - 2025-26 season) Never done  . Influenza Vaccine  09/22/2024 (Originally 01/24/2024)  . DTaP/Tdap/Td (1 - Tdap) 05/28/2025 (Originally 06/29/2010)  . HIV Screening  Completed  . Pneumococcal Vaccine  Aged Out  . Meningococcal B Vaccine  Aged Out    Discussed health benefits of physical activity, and encouraged her to engage in regular exercise appropriate for her age and condition.  Problem List Items Addressed This Visit       Other   Class 3 severe obesity without serious comorbidity with body mass index (BMI) of 40.0 to 44.9 in adult [facility])   Labs ordered, further recommendations may be made based upon these results       Relevant Orders   CBC (Completed)   Comprehensive metabolic panel with GFR (Completed)   Hemoglobin A1c (Completed)   Lipid panel (Completed)   TSH (Completed)   VITAMIN D  25 Hydroxy (Vit-D Deficiency, Fractures) (Completed)   Encounter for general adult  medical examination with abnormal findings - Primary   General adult medical examination Routine exam. - Ordered routine lab work. - Requested previous Pap smear records from [facility]. - Flu shot declined, tetanus booster declined - Handout regarding general health maintenance provided.       Relevant Orders   CBC (Completed)   Comprehensive metabolic panel with GFR (Completed)   Hemoglobin A1c (Completed)   Lipid panel (Completed)   TSH (Completed)   VITAMIN D  25 Hydroxy (Vit-D Deficiency, Fractures) (Completed)   Vitamin D  deficiency   Labs ordered, further recommendations may be made based upon his results       Relevant Orders   CBC (Completed)   Comprehensive metabolic panel with GFR (Completed)   Hemoglobin A1c (Completed)   Lipid panel (Completed)   TSH (Completed)   VITAMIN D  25 Hydroxy (Vit-D Deficiency, Fractures) (Completed)   Assessment and Plan Assessment & Plan General adult medical examination Routine exam. - Ordered routine lab work. - Requested previous Pap smear records from [facility]. - Flu shot declined, tetanus booster declined - Handout regarding general health maintenance provided.   Cerumen impaction Gentle ear lavage performed    Return in about 1 year (around 05/28/2025) for CPE with Velda Wendt.     Lauraine FORBES Pereyra, NP

## 2024-05-28 NOTE — Assessment & Plan Note (Signed)
 Labs ordered, further recommendations may be made based upon his results.

## 2024-05-28 NOTE — Assessment & Plan Note (Signed)
 Labs ordered, further recommendations may be made based upon these results

## 2024-05-28 NOTE — Assessment & Plan Note (Signed)
 General adult medical examination Routine exam. - Ordered routine lab work. - Requested previous Pap smear records from [facility]. - Flu shot declined, tetanus booster declined - Handout regarding general health maintenance provided.

## 2024-05-29 ENCOUNTER — Ambulatory Visit: Payer: Self-pay | Admitting: Nurse Practitioner

## 2024-05-29 DIAGNOSIS — D509 Iron deficiency anemia, unspecified: Secondary | ICD-10-CM

## 2024-05-29 DIAGNOSIS — E559 Vitamin D deficiency, unspecified: Secondary | ICD-10-CM

## 2024-05-29 MED ORDER — VITAMIN D (ERGOCALCIFEROL) 1.25 MG (50000 UNIT) PO CAPS: 50000.0000 [IU] | ORAL_CAPSULE | ORAL | 0 refills | Status: AC

## 2024-07-19 ENCOUNTER — Encounter: Payer: Self-pay | Admitting: Nurse Practitioner

## 2024-07-23 ENCOUNTER — Telehealth: Admitting: Nurse Practitioner

## 2024-07-23 ENCOUNTER — Telehealth: Payer: Self-pay | Admitting: Nurse Practitioner

## 2024-07-23 VITALS — Ht 63.0 in | Wt 231.0 lb

## 2024-07-23 DIAGNOSIS — Z6841 Body Mass Index (BMI) 40.0 and over, adult: Secondary | ICD-10-CM

## 2024-07-23 DIAGNOSIS — E559 Vitamin D deficiency, unspecified: Secondary | ICD-10-CM | POA: Diagnosis not present

## 2024-07-23 DIAGNOSIS — D649 Anemia, unspecified: Secondary | ICD-10-CM

## 2024-07-23 DIAGNOSIS — E66813 Obesity, class 3: Secondary | ICD-10-CM | POA: Diagnosis not present

## 2024-07-23 NOTE — Progress Notes (Signed)
 "  Established Patient Office Visit  An audio/visual tele-health visit was completed today for this patient. I connected with  Jill Mason on 07/23/24 utilizing audio/visual technology and verified that I am speaking with the correct person using two identifiers. The patient was located at their home, and I was located at the office of [facility] at [facility] during the encounter. I discussed the limitations of evaluation and management by telemedicine. The patient expressed understanding and agreed to proceed.     Subjective   Patient ID: Jill Mason, female    DOB: [birth year]  Age: 34 y.o. MRN: [id number]  Chief Complaint  Patient presents with   Obesity    Discussed the use of AI scribe software for clinical note transcription with the patient, who gave verbal consent to proceed.  History of Present Illness Jill Mason is a 34 year old female with obesity who presents with difficulty losing weight despite lifestyle modifications.  Obesity - Obesity with BMI 40.92 (weight range 223-231 lb, height 5'3) - Difficulty losing weight despite over twelve weeks of lifestyle modifications - Maximum weight loss achieved is 10-15 lb with strict efforts - No history of weight loss medication use  Lifestyle modifications - Intermittent fasting 16 hours per day, approximately five days per week - On fasting days, consumes one meal with a goal of about 1500 calories - On non-fasting days, consumes up to 2000 calories in two to three meals, primarily lean protein and healthy options - Tracks caloric intake using a calorie tracker app - Engages in exercise at the [facility] or independently 4-5 days per week, 45-60 minutes per session - No sustained weight loss despite these interventions  Endocrine evaluation - Laboratory testing for thyroid  dysfunction and insulin resistance performed in December. Labs negative. - No family history of thyroid  cancer or  endocrine neoplasia  Associated symptoms - No lightheadedness, dizziness, extreme fatigue, or shortness of breath during fasting      ROS: see HPI    Objective:     Ht 5' 3 (1.6 m)   Wt 231 lb (104.8 kg)   BMI 40.92 kg/m  BP Readings from Last 3 Encounters:  05/28/24 128/70  11/15/23 132/82  04/05/23 114/67   Wt Readings from Last 3 Encounters:  07/23/24 231 lb (104.8 kg)  05/28/24 231 lb (104.8 kg)  11/15/23 225 lb 8 oz (102.3 kg)      Physical Exam Comprehensive physical exam not completed today as office visit was conducted remotely.  Patient appears well over video.  Patient was alert and oriented, and appeared to have appropriate judgment.   No results found for any visits on 07/23/24.    The ASCVD Risk score (Arnett DK, et al., 2019) failed to calculate for the following reasons:   The 2019 ASCVD risk score is only valid for ages 26 to 21    Assessment & Plan:   Problem List Items Addressed This Visit       Other   Class 3 severe obesity without serious comorbidity with body mass index (BMI) of 40.0 to 44.9 in adult [facility]) - Primary   Obesity BMI 40.92. Weight loss plateau despite lifestyle changes. No thyroid  dysfunction or insulin resistance. Considering pharmacotherapy. - Initiated prior authorization for Zepbound. - Scheduled follow-up in 6 weeks to assess medication tolerance and weight. - Consider Wegovy pill if Zepbound not approved due to affordability - Advised on contraception to prevent pregnancy while on medication. Patient reports no sexual activity  since last menstrual cycle - Educated on potential side effects of Zepbound: nausea, pancreatitis, gallstones, rare thyroid /endocrine cancers. - Instructed on injection technique and site rotation for Zepbound. - Encouraged continuation of exercise and strength training.     Assessment and Plan Assessment & Plan Obesity BMI 40.92. Weight loss plateau despite lifestyle changes. No thyroid   dysfunction or insulin resistance. Considering pharmacotherapy. - Initiated prior authorization for Zepbound. - Scheduled follow-up in 6 weeks to assess medication tolerance and weight. - Consider Wegovy pill if Zepbound not approved due to affordability - Advised on contraception to prevent pregnancy while on medication. Patient reports no sexual activity since last menstrual cycle - Educated on potential side effects of Zepbound: nausea, pancreatitis, gallstones, rare thyroid /endocrine cancers. - Instructed on injection technique and site rotation for Zepbound. - Encouraged continuation of exercise and strength training.  Vitamin D  deficiency Completed 8-week vitamin D  supplementation. - Ordered vitamin D  level test to assess current status.  Anemia Previous anemia with low iron levels. - Ordered iron level test to assess current status.    No follow-ups on file.    Lauraine FORBES Pereyra, NP  "

## 2024-07-23 NOTE — Assessment & Plan Note (Signed)
 Obesity BMI 40.92. Weight loss plateau despite lifestyle changes. No thyroid  dysfunction or insulin resistance. Considering pharmacotherapy. - Initiated prior authorization for Zepbound. - Scheduled follow-up in 6 weeks to assess medication tolerance and weight. - Consider Wegovy pill if Zepbound not approved due to affordability - Advised on contraception to prevent pregnancy while on medication. Patient reports no sexual activity since last menstrual cycle - Educated on potential side effects of Zepbound: nausea, pancreatitis, gallstones, rare thyroid /endocrine cancers. - Instructed on injection technique and site rotation for Zepbound. - Encouraged continuation of exercise and strength training.

## 2024-07-23 NOTE — Telephone Encounter (Signed)
 Please start prior auth for Zepbound for treatment of Obesity. BMI is 40.92. patient has tried and failed lifestyle measures alone. Has been trying to lose weight for > 12 weeks, she has been unable to sustain weight loss with diet and exercise alone.

## 2024-09-04 ENCOUNTER — Telehealth: Admitting: Nurse Practitioner
